# Patient Record
Sex: Female | Born: 2000 | Hispanic: Yes | Marital: Single
Health system: Midwestern US, Community
[De-identification: ages and names within clinical notes are randomized; demographics above are authoritative.]

## PROBLEM LIST (undated history)

## (undated) ENCOUNTER — Inpatient Hospital Stay: Payer: Self-pay

## (undated) DIAGNOSIS — F329 Major depressive disorder, single episode, unspecified: Secondary | ICD-10-CM

## (undated) DIAGNOSIS — D649 Anemia, unspecified: Secondary | ICD-10-CM

## (undated) DIAGNOSIS — K121 Other forms of stomatitis: Secondary | ICD-10-CM

## (undated) HISTORY — PX: NO PAST SURGERIES: SHX2092

---
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
## 2022-03-20 ENCOUNTER — Encounter: Attending: Obstetrics & Gynecology | Primary: Diagnostic Radiology

## 2022-10-05 ENCOUNTER — Inpatient Hospital Stay
Admit: 2022-10-05 | Discharge: 2022-10-05 | Disposition: A | Discharge status: Home / Self Care | Payer: MEDICAID | Attending: Obstetrics & Gynecology

## 2022-10-05 DIAGNOSIS — O26893 Other specified pregnancy related conditions, third trimester: Secondary | ICD-10-CM

## 2022-10-05 DIAGNOSIS — N898 Other specified noninflammatory disorders of vagina: Secondary | ICD-10-CM

## 2022-10-05 LAB — AMNIOTIC FLUID PROTEIN: Protein, Amniotic Fluid: NEGATIVE

## 2022-10-05 MED ORDER — SODIUM CHLORIDE 0.9 % IV BOLUS
0.9 | Freq: Once | INTRAVENOUS | Status: AC
Start: 2022-10-05 — End: 2022-10-05
  Administered 2022-10-05: 06:00:00 1000 mL via INTRAVENOUS

## 2022-10-05 NOTE — Progress Notes (Signed)
OBED update:    No complaints    BP 118/71   Pulse 99   Temp 98.6 F (37 C) (Oral)   Resp 16   SpO2 99%     Exam deferred    FHR 150, Cat. 1 and reactive  No contractions    AFP negative    A:  Term, no evidence of labor  P:  Home with precautions.  Instructed to f/u at [facility] on Monday to schedule IOL if she has not gone into labor

## 2022-10-05 NOTE — ED Triage Notes (Signed)
G2P1  39.[redacted] weeks gestation  Arrives to OBED with complaints of leaking of fluids and contractions with back pain. Denies vaginal bleeding

## 2022-10-05 NOTE — ED Provider Notes (Signed)
OBED H&P:    22 y.o., G2P1, 39.6 weeks, seen at [facility].  She presents with c/o clear vaginal discharge for two weeks.  She reports a contraction every 2 hours.  She denies bleeding.  She notes GFM. Pregnancy uncomplicated to date.    She has had one term NSVD without complications    No long term health issues    10 point ROS negative    BP 118/71   Pulse 99   Temp 98.6 F (37 C) (Oral)   Resp 16   SpO2 99%     Exam:    Alert and oriented  EOMI  CV normal rate  Unlabored breathing  Abdomen gravid and non tender  Pelvic:  No fluid noted with Valsalva.  AFP done.  Cervix 2/50/-3  Extremities without edema  Skin warm and dry  Neuro grossly intact    FHR initially 150 and cat. 1.  FHR now 160 with moderate variablity and accels, Cat. 1  Toco with irritability, no contractions noted    A:  ?SROM at term  P:  Check AFP.  Will IV hydrate while waiting for labs.
